# Patient Record
Sex: Male | Born: 1995 | Race: White | Hispanic: No | Marital: Single | Smoking: Never smoker
Health system: Southern US, Community
[De-identification: ages and names within clinical notes are randomized; demographics above are authoritative.]

## PROBLEM LIST (undated history)

## (undated) DIAGNOSIS — F32A Depression, unspecified: Secondary | ICD-10-CM

## (undated) DIAGNOSIS — F329 Major depressive disorder, single episode, unspecified: Secondary | ICD-10-CM

---
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
## 2016-06-23 DIAGNOSIS — S161XXA Strain of muscle, fascia and tendon at neck level, initial encounter: Secondary | ICD-10-CM | POA: Diagnosis not present

## 2016-06-23 DIAGNOSIS — S8002XA Contusion of left knee, initial encounter: Secondary | ICD-10-CM | POA: Diagnosis not present

## 2016-06-29 DIAGNOSIS — S8002XA Contusion of left knee, initial encounter: Secondary | ICD-10-CM | POA: Diagnosis not present

## 2016-06-29 DIAGNOSIS — M2242 Chondromalacia patellae, left knee: Secondary | ICD-10-CM | POA: Diagnosis not present

## 2016-08-03 DIAGNOSIS — S83282A Other tear of lateral meniscus, current injury, left knee, initial encounter: Secondary | ICD-10-CM | POA: Diagnosis not present

## 2016-08-10 DIAGNOSIS — M25562 Pain in left knee: Secondary | ICD-10-CM | POA: Diagnosis not present

## 2016-08-12 DIAGNOSIS — M25562 Pain in left knee: Secondary | ICD-10-CM | POA: Diagnosis not present

## 2016-09-07 DIAGNOSIS — H5213 Myopia, bilateral: Secondary | ICD-10-CM | POA: Diagnosis not present

## 2016-12-15 DIAGNOSIS — J029 Acute pharyngitis, unspecified: Secondary | ICD-10-CM | POA: Diagnosis not present

## 2017-01-26 DIAGNOSIS — M25562 Pain in left knee: Secondary | ICD-10-CM | POA: Diagnosis not present

## 2017-01-28 DIAGNOSIS — M25562 Pain in left knee: Secondary | ICD-10-CM | POA: Diagnosis not present

## 2017-01-28 DIAGNOSIS — M222X2 Patellofemoral disorders, left knee: Secondary | ICD-10-CM | POA: Diagnosis not present

## 2017-01-28 DIAGNOSIS — R531 Weakness: Secondary | ICD-10-CM | POA: Diagnosis not present

## 2017-01-31 DIAGNOSIS — R531 Weakness: Secondary | ICD-10-CM | POA: Diagnosis not present

## 2017-01-31 DIAGNOSIS — M25562 Pain in left knee: Secondary | ICD-10-CM | POA: Diagnosis not present

## 2017-01-31 DIAGNOSIS — M222X2 Patellofemoral disorders, left knee: Secondary | ICD-10-CM | POA: Diagnosis not present

## 2017-02-04 DIAGNOSIS — M25562 Pain in left knee: Secondary | ICD-10-CM | POA: Diagnosis not present

## 2017-02-04 DIAGNOSIS — R531 Weakness: Secondary | ICD-10-CM | POA: Diagnosis not present

## 2017-02-04 DIAGNOSIS — M222X2 Patellofemoral disorders, left knee: Secondary | ICD-10-CM | POA: Diagnosis not present

## 2017-02-07 DIAGNOSIS — M222X2 Patellofemoral disorders, left knee: Secondary | ICD-10-CM | POA: Diagnosis not present

## 2017-02-07 DIAGNOSIS — R531 Weakness: Secondary | ICD-10-CM | POA: Diagnosis not present

## 2017-02-07 DIAGNOSIS — M25562 Pain in left knee: Secondary | ICD-10-CM | POA: Diagnosis not present

## 2017-02-14 DIAGNOSIS — R531 Weakness: Secondary | ICD-10-CM | POA: Diagnosis not present

## 2017-02-14 DIAGNOSIS — M25562 Pain in left knee: Secondary | ICD-10-CM | POA: Diagnosis not present

## 2017-02-14 DIAGNOSIS — M222X2 Patellofemoral disorders, left knee: Secondary | ICD-10-CM | POA: Diagnosis not present

## 2017-02-17 DIAGNOSIS — M25562 Pain in left knee: Secondary | ICD-10-CM | POA: Diagnosis not present

## 2017-02-17 DIAGNOSIS — R531 Weakness: Secondary | ICD-10-CM | POA: Diagnosis not present

## 2017-02-17 DIAGNOSIS — M222X2 Patellofemoral disorders, left knee: Secondary | ICD-10-CM | POA: Diagnosis not present

## 2017-02-24 DIAGNOSIS — M222X2 Patellofemoral disorders, left knee: Secondary | ICD-10-CM | POA: Diagnosis not present

## 2017-02-24 DIAGNOSIS — M25562 Pain in left knee: Secondary | ICD-10-CM | POA: Diagnosis not present

## 2017-02-24 DIAGNOSIS — R531 Weakness: Secondary | ICD-10-CM | POA: Diagnosis not present

## 2017-03-03 DIAGNOSIS — M222X2 Patellofemoral disorders, left knee: Secondary | ICD-10-CM | POA: Diagnosis not present

## 2017-03-03 DIAGNOSIS — R531 Weakness: Secondary | ICD-10-CM | POA: Diagnosis not present

## 2017-03-03 DIAGNOSIS — M25562 Pain in left knee: Secondary | ICD-10-CM | POA: Diagnosis not present

## 2017-03-09 DIAGNOSIS — F341 Dysthymic disorder: Secondary | ICD-10-CM | POA: Diagnosis not present

## 2017-03-09 DIAGNOSIS — F422 Mixed obsessional thoughts and acts: Secondary | ICD-10-CM | POA: Diagnosis not present

## 2017-03-09 DIAGNOSIS — F322 Major depressive disorder, single episode, severe without psychotic features: Secondary | ICD-10-CM | POA: Diagnosis not present

## 2017-03-09 DIAGNOSIS — F411 Generalized anxiety disorder: Secondary | ICD-10-CM | POA: Diagnosis not present

## 2017-03-14 DIAGNOSIS — F411 Generalized anxiety disorder: Secondary | ICD-10-CM | POA: Diagnosis not present

## 2017-03-14 DIAGNOSIS — F4010 Social phobia, unspecified: Secondary | ICD-10-CM | POA: Diagnosis not present

## 2017-03-14 DIAGNOSIS — F322 Major depressive disorder, single episode, severe without psychotic features: Secondary | ICD-10-CM | POA: Diagnosis not present

## 2017-03-22 DIAGNOSIS — L700 Acne vulgaris: Secondary | ICD-10-CM | POA: Diagnosis not present

## 2017-03-24 DIAGNOSIS — F322 Major depressive disorder, single episode, severe without psychotic features: Secondary | ICD-10-CM | POA: Diagnosis not present

## 2017-03-24 DIAGNOSIS — F4010 Social phobia, unspecified: Secondary | ICD-10-CM | POA: Diagnosis not present

## 2017-03-24 DIAGNOSIS — F422 Mixed obsessional thoughts and acts: Secondary | ICD-10-CM | POA: Diagnosis not present

## 2017-03-24 DIAGNOSIS — F411 Generalized anxiety disorder: Secondary | ICD-10-CM | POA: Diagnosis not present

## 2017-04-04 DIAGNOSIS — F322 Major depressive disorder, single episode, severe without psychotic features: Secondary | ICD-10-CM | POA: Diagnosis not present

## 2017-04-06 ENCOUNTER — Emergency Department (HOSPITAL_COMMUNITY)
Admission: EM | Admit: 2017-04-06 | Discharge: 2017-04-06 | Disposition: A | Discharge status: Home / Self Care | Payer: BLUE CROSS/BLUE SHIELD | Attending: Emergency Medicine | Admitting: Emergency Medicine

## 2017-04-06 ENCOUNTER — Emergency Department (HOSPITAL_COMMUNITY): Payer: BLUE CROSS/BLUE SHIELD

## 2017-04-06 ENCOUNTER — Encounter (HOSPITAL_COMMUNITY): Payer: Self-pay | Admitting: Emergency Medicine

## 2017-04-06 DIAGNOSIS — R079 Chest pain, unspecified: Secondary | ICD-10-CM | POA: Diagnosis not present

## 2017-04-06 DIAGNOSIS — K30 Functional dyspepsia: Secondary | ICD-10-CM | POA: Diagnosis not present

## 2017-04-06 DIAGNOSIS — F411 Generalized anxiety disorder: Secondary | ICD-10-CM | POA: Diagnosis not present

## 2017-04-06 DIAGNOSIS — R0602 Shortness of breath: Secondary | ICD-10-CM | POA: Diagnosis not present

## 2017-04-06 DIAGNOSIS — R0789 Other chest pain: Secondary | ICD-10-CM

## 2017-04-06 DIAGNOSIS — T50905A Adverse effect of unspecified drugs, medicaments and biological substances, initial encounter: Secondary | ICD-10-CM | POA: Diagnosis not present

## 2017-04-06 DIAGNOSIS — F422 Mixed obsessional thoughts and acts: Secondary | ICD-10-CM | POA: Diagnosis not present

## 2017-04-06 DIAGNOSIS — F322 Major depressive disorder, single episode, severe without psychotic features: Secondary | ICD-10-CM | POA: Diagnosis not present

## 2017-04-06 HISTORY — DX: Depression, unspecified: F32.A

## 2017-04-06 HISTORY — DX: Major depressive disorder, single episode, unspecified: F32.9

## 2017-04-06 LAB — CBC
HCT: 44.5 % (ref 39.0–52.0)
Hemoglobin: 14.5 g/dL (ref 13.0–17.0)
MCH: 28.7 pg (ref 26.0–34.0)
MCHC: 32.6 g/dL (ref 30.0–36.0)
MCV: 88.1 fL (ref 78.0–100.0)
PLATELETS: 156 10*3/uL (ref 150–400)
RBC: 5.05 MIL/uL (ref 4.22–5.81)
RDW: 13.3 % (ref 11.5–15.5)
WBC: 5.8 10*3/uL (ref 4.0–10.5)

## 2017-04-06 LAB — BASIC METABOLIC PANEL
Anion gap: 6 (ref 5–15)
BUN: 7 mg/dL (ref 6–20)
CHLORIDE: 105 mmol/L (ref 101–111)
CO2: 26 mmol/L (ref 22–32)
CREATININE: 0.93 mg/dL (ref 0.61–1.24)
Calcium: 9.1 mg/dL (ref 8.9–10.3)
GFR calc Af Amer: >60 mL/min (ref >60)
GFR calc non Af Amer: >60 mL/min (ref >60)
Glucose, Bld: 94 mg/dL (ref 65–99)
Potassium: 4.1 mmol/L (ref 3.5–5.1)
SODIUM: 137 mmol/L (ref 135–145)

## 2017-04-06 LAB — I-STAT TROPONIN, ED: Troponin i, poc: 0 ng/mL (ref 0.00–0.08)

## 2017-04-06 MED ORDER — OMEPRAZOLE 20 MG PO CPDR: 20.0000 mg | DELAYED_RELEASE_CAPSULE | Freq: Every day | ORAL | 0 refills | Status: DC

## 2017-04-06 NOTE — Discharge Instructions (Signed)
Please read attached information. If you experience any new or worsening signs or symptoms please return to the emergency room for evaluation. Please follow-up with your primary care provider or specialist as discussed. Please use medication prescribed only as directed and discontinue taking if you have any concerning signs or symptoms.   °

## 2017-04-06 NOTE — ED Triage Notes (Signed)
Cp he had this am mid chest gone now but le.ft him feeling weak  And nausea  And he felt sob, denies radiation to arm neck or shoulder

## 2017-04-06 NOTE — ED Provider Notes (Signed)
[facility]-EMERGENCY DEPT Provider Note   CSN: [id number] Arrival date & time: 04/06/17  1022  By signing my name below, I, [name], attest that this documentation has been prepared under the direction and in the presence of [employer], PA-C. Electronically Signed: [name], Scribe. 04/06/17. 1:00 PM.  History   Chief Complaint Chief Complaint  Patient presents with  . Chest Pain    HPI Comments: Jonathan Benson is a 21 y.o. male with a history of anxiety and depression, who presents to the Emergency Department complaining of gradual-onset, intermittent chest pain that began earlier this morning. Patient states he developed pressure-like chest pain at 8 AM this morning while driving. Patient states this episode lasted 10 minutes.  Patient states his symptoms have gradually improved since the day has progressed. Patient reports being seen at urgent care earlier today, he was advised to come to the emergency department for evaluation. Patient reports associated fatigue and nausea. No medications taken prior to arrival. Patient denies any tobacco use, increases caffine intake, recent travel, recent surgeries, hx of asthma, or any hx of CAD. Patient denies any shortness of breath, diaphoresis, leg swelling, fever, or chills.   The history is provided by the patient. No language interpreter was used.    Past Medical History:  Diagnosis Date  . Depression     There are no active problems to display for this patient.   No past surgical history on file.     Home Medications    Prior to Admission medications   Medication Sig Start Date End Date Taking? Authorizing Provider  omeprazole (PRILOSEC) 20 MG capsule Take 1 capsule (20 mg total) by mouth daily. 04/06/17   Eyvonne Mechanic, PA-C    Family History No family history on file.  Social History Social History  Substance Use Topics  . Smoking status: Never Smoker  . Smokeless tobacco: Never Used  . Alcohol use No      Allergies   Patient has no known allergies.   Review of Systems Review of Systems  Constitutional: Positive for fatigue. Negative for chills, diaphoresis and fever.  Respiratory: Positive for shortness of breath.   Cardiovascular: Positive for chest pain. Negative for leg swelling.  Gastrointestinal: Positive for nausea. Negative for diarrhea and vomiting.  All other systems reviewed and are negative.    Physical Exam Updated Vital Signs BP 110/66 (BP Location: Right Arm)   Pulse 76   Temp 98.2 F (36.8 C) (Oral)   Resp 19   SpO2 97%   Physical Exam  Constitutional: He is oriented to person, place, and time. He appears well-developed.  HENT:  Head: Normocephalic and atraumatic.  Mouth/Throat: Oropharynx is clear and moist.  Eyes: Conjunctivae and EOM are normal. Pupils are equal, round, and reactive to light.  Neck: Normal range of motion. Neck supple.  Cardiovascular: Normal rate, regular rhythm, normal heart sounds and intact distal pulses.  Exam reveals no gallop and no friction rub.   No murmur heard. Pulmonary/Chest: Effort normal and breath sounds normal. He has no wheezes. He has no rales. He exhibits tenderness.  Tenderness over the left lateral chest wall.   Abdominal: Soft. Bowel sounds are normal. There is no tenderness.  Musculoskeletal: Normal range of motion. He exhibits no edema or tenderness.  No lower extremity edema.   Neurological: He is alert and oriented to person, place, and time.  Skin: Skin is warm and dry.  Psychiatric: He has a normal mood and affect.  Nursing note  and vitals reviewed.    ED Treatments / Results  DIAGNOSTIC STUDIES: Oxygen Saturation is 97% on RA, normal by my interpretation.    COORDINATION OF CARE: 12:59 PM Discussed treatment plan with pt at bedside and pt agreed to plan.   Labs (all labs ordered are listed, but only abnormal results are displayed) Labs Reviewed  BASIC METABOLIC PANEL  CBC  I-STAT TROPOININ,  ED    EKG  EKG Interpretation  Date/Time:  Wednesday April 06 2017 10:45:28 EDT Ventricular Rate:  70 PR Interval:  146 QRS Duration: 86 QT Interval:  368 QTC Calculation: 397 R Axis:   90 Text Interpretation:  Normal sinus rhythm Rightward axis Borderline ECG Confirmed by Donnetta Hutchingook, Brian ([id number]) on 04/07/2017 1:08:09 PM       Radiology Dg Chest 2 View  Result Date: 04/06/2017 CLINICAL DATA:  Chest pain, SOB and overall weakness since this morning - no known heart or lung issues, nonsmoker EXAM: CHEST  2 VIEW COMPARISON:  None. FINDINGS: Normal mediastinum and cardiac silhouette. Normal pulmonary vasculature. No evidence of effusion, infiltrate, or pneumothorax. No acute bony abnormality. IMPRESSION: Normal chest radiograph. Electronically Signed   By: Genevive BiStewart  Edmunds M.D.   On: 04/06/2017 11:11    Procedures Procedures (including critical care time)  Medications Ordered in ED Medications - No data to display   Initial Impression / Assessment and Plan / ED Course  I have reviewed the triage vital signs and the nursing notes.  Pertinent labs & imaging results that were available during my care of the patient were reviewed by me and considered in my medical decision making (see chart for details).      Final Clinical Impressions(s) / ED Diagnoses   Final diagnoses:  Chest wall pain  Indigestion    21 year old male presents today with vague complaints of chest pain.  Likely chest wall pain.  Patient also having indigestion.  Patient was given Prilosec, outpatient follow-up and strict return precautions.  Both patient and his mother verbalized understanding and agreement to today's plan had no further questions or concerns the time discharge  New Prescriptions Discharge Medication List as of 04/06/2017 12:41 PM    START taking these medications   Details  omeprazole (PRILOSEC) 20 MG capsule Take 1 capsule (20 mg total) by mouth daily., Starting Wed 04/06/2017, Print         I personally performed the services described in this documentation, which was scribed in my presence. The recorded information has been reviewed and is accurate.     Eyvonne MechanicHedges, Temperance Kelemen, PA-C 04/07/17 2031    Geoffery Lyonselo, Douglas, MD 04/09/17 0000

## 2017-04-07 DIAGNOSIS — F422 Mixed obsessional thoughts and acts: Secondary | ICD-10-CM | POA: Diagnosis not present

## 2017-04-07 DIAGNOSIS — F322 Major depressive disorder, single episode, severe without psychotic features: Secondary | ICD-10-CM | POA: Diagnosis not present

## 2017-04-07 DIAGNOSIS — F411 Generalized anxiety disorder: Secondary | ICD-10-CM | POA: Diagnosis not present

## 2017-04-07 DIAGNOSIS — F4010 Social phobia, unspecified: Secondary | ICD-10-CM | POA: Diagnosis not present

## 2017-04-19 DIAGNOSIS — F322 Major depressive disorder, single episode, severe without psychotic features: Secondary | ICD-10-CM | POA: Diagnosis not present

## 2017-04-19 DIAGNOSIS — F4010 Social phobia, unspecified: Secondary | ICD-10-CM | POA: Diagnosis not present

## 2017-04-19 DIAGNOSIS — F411 Generalized anxiety disorder: Secondary | ICD-10-CM | POA: Diagnosis not present

## 2017-04-19 DIAGNOSIS — F422 Mixed obsessional thoughts and acts: Secondary | ICD-10-CM | POA: Diagnosis not present

## 2017-04-20 DIAGNOSIS — F422 Mixed obsessional thoughts and acts: Secondary | ICD-10-CM | POA: Diagnosis not present

## 2017-04-20 DIAGNOSIS — F411 Generalized anxiety disorder: Secondary | ICD-10-CM | POA: Diagnosis not present

## 2017-04-20 DIAGNOSIS — F322 Major depressive disorder, single episode, severe without psychotic features: Secondary | ICD-10-CM | POA: Diagnosis not present

## 2017-04-20 DIAGNOSIS — F4010 Social phobia, unspecified: Secondary | ICD-10-CM | POA: Diagnosis not present

## 2017-05-03 DIAGNOSIS — F411 Generalized anxiety disorder: Secondary | ICD-10-CM | POA: Diagnosis not present

## 2017-05-03 DIAGNOSIS — F322 Major depressive disorder, single episode, severe without psychotic features: Secondary | ICD-10-CM | POA: Diagnosis not present

## 2017-05-03 DIAGNOSIS — F422 Mixed obsessional thoughts and acts: Secondary | ICD-10-CM | POA: Diagnosis not present

## 2017-05-03 DIAGNOSIS — F4010 Social phobia, unspecified: Secondary | ICD-10-CM | POA: Diagnosis not present

## 2017-05-04 DIAGNOSIS — F422 Mixed obsessional thoughts and acts: Secondary | ICD-10-CM | POA: Diagnosis not present

## 2017-05-04 DIAGNOSIS — F4010 Social phobia, unspecified: Secondary | ICD-10-CM | POA: Diagnosis not present

## 2017-05-04 DIAGNOSIS — F322 Major depressive disorder, single episode, severe without psychotic features: Secondary | ICD-10-CM | POA: Diagnosis not present

## 2017-05-04 DIAGNOSIS — F411 Generalized anxiety disorder: Secondary | ICD-10-CM | POA: Diagnosis not present

## 2017-05-18 DIAGNOSIS — F322 Major depressive disorder, single episode, severe without psychotic features: Secondary | ICD-10-CM | POA: Diagnosis not present

## 2017-05-18 DIAGNOSIS — F411 Generalized anxiety disorder: Secondary | ICD-10-CM | POA: Diagnosis not present

## 2017-05-18 DIAGNOSIS — F422 Mixed obsessional thoughts and acts: Secondary | ICD-10-CM | POA: Diagnosis not present

## 2017-05-19 DIAGNOSIS — F422 Mixed obsessional thoughts and acts: Secondary | ICD-10-CM | POA: Diagnosis not present

## 2017-05-19 DIAGNOSIS — F322 Major depressive disorder, single episode, severe without psychotic features: Secondary | ICD-10-CM | POA: Diagnosis not present

## 2017-05-19 DIAGNOSIS — F411 Generalized anxiety disorder: Secondary | ICD-10-CM | POA: Diagnosis not present

## 2017-05-19 DIAGNOSIS — F4010 Social phobia, unspecified: Secondary | ICD-10-CM | POA: Diagnosis not present

## 2017-06-02 DIAGNOSIS — F4010 Social phobia, unspecified: Secondary | ICD-10-CM | POA: Diagnosis not present

## 2017-06-02 DIAGNOSIS — F411 Generalized anxiety disorder: Secondary | ICD-10-CM | POA: Diagnosis not present

## 2017-06-02 DIAGNOSIS — F422 Mixed obsessional thoughts and acts: Secondary | ICD-10-CM | POA: Diagnosis not present

## 2017-06-02 DIAGNOSIS — F322 Major depressive disorder, single episode, severe without psychotic features: Secondary | ICD-10-CM | POA: Diagnosis not present

## 2017-06-08 DIAGNOSIS — F422 Mixed obsessional thoughts and acts: Secondary | ICD-10-CM | POA: Diagnosis not present

## 2017-06-08 DIAGNOSIS — F411 Generalized anxiety disorder: Secondary | ICD-10-CM | POA: Diagnosis not present

## 2017-06-08 DIAGNOSIS — F4010 Social phobia, unspecified: Secondary | ICD-10-CM | POA: Diagnosis not present

## 2017-06-08 DIAGNOSIS — F322 Major depressive disorder, single episode, severe without psychotic features: Secondary | ICD-10-CM | POA: Diagnosis not present

## 2017-08-19 DIAGNOSIS — J039 Acute tonsillitis, unspecified: Secondary | ICD-10-CM | POA: Diagnosis not present

## 2017-09-07 ENCOUNTER — Ambulatory Visit: Payer: BLUE CROSS/BLUE SHIELD | Admitting: Psychology

## 2017-09-08 DIAGNOSIS — J020 Streptococcal pharyngitis: Secondary | ICD-10-CM | POA: Diagnosis not present

## 2017-09-13 ENCOUNTER — Ambulatory Visit: Payer: BLUE CROSS/BLUE SHIELD | Admitting: Psychology

## 2017-10-25 DIAGNOSIS — J039 Acute tonsillitis, unspecified: Secondary | ICD-10-CM | POA: Diagnosis not present

## 2017-12-22 DIAGNOSIS — H52203 Unspecified astigmatism, bilateral: Secondary | ICD-10-CM | POA: Diagnosis not present

## 2017-12-22 DIAGNOSIS — H5213 Myopia, bilateral: Secondary | ICD-10-CM | POA: Diagnosis not present

## 2018-02-28 DIAGNOSIS — R5383 Other fatigue: Secondary | ICD-10-CM | POA: Diagnosis not present

## 2018-05-17 ENCOUNTER — Encounter: Payer: Self-pay | Admitting: Physician Assistant

## 2018-05-17 ENCOUNTER — Ambulatory Visit: Payer: BLUE CROSS/BLUE SHIELD | Admitting: Physician Assistant

## 2018-05-17 ENCOUNTER — Other Ambulatory Visit: Payer: Self-pay

## 2018-05-17 VITALS — BP 124/70 | HR 86 | Temp 98.8°F | Resp 18 | Ht 65.55 in | Wt 136.2 lb

## 2018-05-17 DIAGNOSIS — J029 Acute pharyngitis, unspecified: Secondary | ICD-10-CM

## 2018-05-17 DIAGNOSIS — L299 Pruritus, unspecified: Secondary | ICD-10-CM | POA: Diagnosis not present

## 2018-05-17 LAB — POCT RAPID STREP A (OFFICE): Rapid Strep A Screen: NEGATIVE

## 2018-05-17 MED ORDER — HYDROXYZINE HCL 25 MG PO TABS: 25.0000 mg | ORAL_TABLET | Freq: Three times a day (TID) | ORAL | 1 refills | Status: AC | PRN

## 2018-05-17 NOTE — Patient Instructions (Addendum)
  Sign up for mychart I will contact you about your strep results.   For sore throat: Stay well hydrated. Get lost of rest. Wash your hands often.  Advil or ibuprofen for pain. Do not take Aspirin.  Drink enough water and fluids to keep your urine clear or pale yellow.  ? Gargle with 8 oz of salt water ( tsp of salt per 1 qt of water) as often as every 1-2 hours to soothe your throat.  Gargle liquid benadryl.  Cepacol throat lozenges (if you are not at risk for choking).  For sore throat try using a honey-based tea. Use 3 teaspoons of honey with juice squeezed from half lemon. Place shaved pieces of ginger into 1/2-1 cup of water and warm over stove top. Then mix the ingredients and repeat every 4 hours as needed.   For your itching, start taking Hydroxyzine '25mg'$  every 4-6 hours as needed Try to notice if there is an association between itching and heat/anxiety/etc.   Come back for an annual exam in the next month or so.   Thank you for coming in today. I hope you feel we met your needs.  Feel free to call PCP if you have any questions or further requests.  Please consider signing up for MyChart if you do not already have it, as this is a great way to communicate with me.  Best,  Whitney McVey, PA-C  IF you received an x-ray today, you will receive an invoice from [facility]. Please contact [facility] at [phone] with questions or concerns regarding your invoice.   IF you received labwork today, you will receive an invoice from [facility]. Please contact [facility] at [phone] with questions or concerns regarding your invoice.   Our billing staff will not be able to assist you with questions regarding bills from these companies.  You will be contacted with the lab results as soon as they are available. The fastest way to get your results is to activate your My Chart account. Instructions are located on the last page of this paperwork. If you have not heard  from [place] regarding the results in 2 weeks, please contact this office.

## 2018-05-17 NOTE — Progress Notes (Signed)
Edger HouseDaniel G [place]adonna  MRN: [id number] DOB: 11-12-1995  PCP: Patient, No Pcp Per  Subjective:  Pt is a pleasant 22 year old male who presents to clinic for sore throat x 3 days.  He is a new patient to this clinic does not have a primary care provider.   Sore throat-  he endorses pain with swallowing.  Pain is of both sides of his throat.  He is not taking anything for his symptoms.  He is able to eat and drink just fine.  Denies cough, fever, chills, nausea, vomiting, difficulty breathing. He went to the urgent care 3 times earlier this year for similar symptoms, rapid strep's were negative x2.  Positive rapid strep at his last visit and he received antibiotics.  He is unaware whether cultures were sent.  Endorses fatigue x 1 year. "I can sleep like 10 hours". Denies new medications, dizziness, lightheadedness, morning headaches, extreme daytime fatigue.    Endorses itching all over. Happens intermittently. Is unsure if this is possibly related to anxiety. Nothing makes it better or worse. Happens more when he is just sitting idle. He has not taken anything to feel better. No new medications. Admits to noticing itching when he was taking Xanax - last dose was about 8 months ago.  Denies rash.  Review of Systems  Constitutional: Positive for fatigue. Negative for chills, diaphoresis and fever.  HENT: Positive for sore throat. Negative for congestion, postnasal drip, rhinorrhea, sinus pressure, sinus pain and sneezing.   Respiratory: Negative for cough, shortness of breath and wheezing.     There are no active problems to display for this patient.   No current outpatient medications on file prior to visit.   No current facility-administered medications on file prior to visit.     No Known Allergies   Objective:  BP 124/70   Pulse 86   Temp 98.8 F (37.1 C) (Oral)   Resp 18   Ht 5' 5.55" (1.665 m)   Wt 136 lb 3.2 oz (61.8 kg)   SpO2 97%   BMI 22.29 kg/m   Physical Exam    Constitutional: He is oriented to person, place, and time. No distress.  HENT:  Right Ear: Tympanic membrane normal.  Left Ear: Tympanic membrane normal.  Nose: Mucosal edema present. No rhinorrhea. Right sinus exhibits no maxillary sinus tenderness and no frontal sinus tenderness. Left sinus exhibits no maxillary sinus tenderness and no frontal sinus tenderness.  Mouth/Throat: Mucous membranes are normal. Posterior oropharyngeal edema and posterior oropharyngeal erythema present. No oropharyngeal exudate or tonsillar abscesses.  Cardiovascular: Normal rate, regular rhythm and normal heart sounds.  Pulmonary/Chest: Effort normal and breath sounds normal. No respiratory distress. He has no wheezes. He has no rales.  Neurological: He is alert and oriented to person, place, and time.  Skin: Skin is warm and dry.  Psychiatric: Judgment normal.  Vitals reviewed.   Results for orders placed or performed in visit on 05/17/18  POCT rapid strep A  Result Value Ref Range   Rapid Strep A Screen Negative Negative    Assessment and Plan :  1. Sore throat -Patient presents with sore throat for the past 3 days.  No red flags.  Vitals are stable.  Rapid strep is negative, culture is pending.  Will contact with results and decide on treatment at that time. - POCT rapid strep A - Culture, Group A Strep  2. Itching -Patient endorses itching all over for over a year.  Prior association with  Xanax, however he has not taken this in 8 months.  No associated rash.  He also endorses fatigue.  Advised patient to make an appointment for an annual exam for routine lab work.  Will treat symptoms with hydroxyzine at this time. - hydrOXYzine (ATARAX/VISTARIL) 25 MG tablet; Take 1 tablet (25 mg total) by mouth every 8 (eight) hours as needed for itching.  Dispense: 30 tablet; Refill: 1    Whitney Reuel Lamadrid, PA-C  [facility] at [facility] 05/17/2018 5:13 PM  Please note: Portions of this  report may have been transcribed using dragon voice recognition software. Every effort was made to ensure accuracy; however, inadvertent computerized transcription errors may be present.

## 2018-05-19 LAB — CULTURE, GROUP A STREP: Strep A Culture: NEGATIVE

## 2018-05-22 ENCOUNTER — Ambulatory Visit: Payer: BLUE CROSS/BLUE SHIELD | Admitting: Physician Assistant

## 2018-05-22 ENCOUNTER — Encounter: Payer: Self-pay | Admitting: Physician Assistant

## 2018-05-22 ENCOUNTER — Other Ambulatory Visit: Payer: Self-pay

## 2018-05-22 VITALS — BP 100/64 | HR 99 | Temp 98.2°F | Resp 16 | Ht 65.0 in | Wt 133.4 lb

## 2018-05-22 DIAGNOSIS — Z113 Encounter for screening for infections with a predominantly sexual mode of transmission: Secondary | ICD-10-CM

## 2018-05-22 DIAGNOSIS — J029 Acute pharyngitis, unspecified: Secondary | ICD-10-CM | POA: Diagnosis not present

## 2018-05-22 LAB — POCT RAPID STREP A (OFFICE): Rapid Strep A Screen: NEGATIVE

## 2018-05-22 MED ORDER — PENICILLIN V POTASSIUM 500 MG PO TABS: 500.0000 mg | ORAL_TABLET | Freq: Two times a day (BID) | ORAL | 0 refills | Status: AC

## 2018-05-22 NOTE — Patient Instructions (Addendum)
Start taking penicillin V 500 mg two times daily for 10 days.  I will contact you with the results of your labs when they come back.   For sore throat: ? Gargle with 8 oz of salt water ( tsp of salt per 1 qt of water) as often as every 1-2 hours to soothe your throat.  Gargle liquid benadryl.  Cepacol throat lozenges.  Stay well hydrated. Get lost of rest. Wash your hands often.   -Foods that can help speed recovery: honey, garlic, chicken soup, elderberries, green tea.  -Supplements that can help speed recovery: vitamin C, zinc, elderberry extract, quercetin, ginseng, selenium -Supplement with prebiotics and probiotics:   Advil or ibuprofen for pain. Do not take Aspirin.    For sore throat try using a honey-based tea. Use 3 teaspoons of honey with juice squeezed from half lemon. Place shaved pieces of ginger into 1/2-1 cup of water and warm over stove top. Then mix the ingredients and repeat every 4 hours as needed.   Safe Sex Practicing safe sex means taking steps before and during sex to reduce your risk of:  Getting an STD (sexually transmitted disease).  Giving your partner an STD.  Unwanted pregnancy.  How can I practice safe sex?  To practice safe sex:  Limit your sexual partners to only one partner who is having sex with only you.  Avoid using alcohol and recreational drugs before having sex. These substances can affect your judgment.  Before having sex with a new partner: ? Talk to your partner about past partners, past STDs, and drug use. ? You and your partner should be screened for STDs and discuss the results with each other.  Check your body regularly for sores, blisters, rashes, or unusual discharge. If you notice any of these problems, visit your health care provider.  If you have symptoms of an infection or you are being treated for an STD, avoid sexual contact.  While having sex, use a condom. Make sure to: ? Use a condom every time you have vaginal,  oral, or anal sex. Both females and males should wear condoms during oral sex. ? Keep condoms in place from the beginning to the end of sexual activity. ? Use a latex condom, if possible. Latex condoms offer the best protection. ? Use only water-based lubricants or oils to lubricate a condom. Using petroleum-based lubricants or oils will weaken the condom and increase the chance that it will break.  See your health care provider for regular screenings, exams, and tests for STDs.  Talk with your health care provider about the form of birth control (contraception) that is best for you.  Get vaccinated against hepatitis B and human papillomavirus (HPV).  If you are at risk of being infected with HIV (human immunodeficiency virus), talk with your health care provider about taking a prescription medicine to prevent HIV infection. You are considered at risk for HIV if: ? You are a man who has sex with other men. ? You are a heterosexual man or woman who is sexually active with more than one partner. ? You take drugs by injection. ? You are sexually active with a partner who has HIV.  This information is not intended to replace advice given to you by your health care provider. Make sure you discuss any questions you have with your health care provider. Document Released: 10/21/2004 Document Revised: 01/28/2016 Document Reviewed: 08/03/2015 Elsevier Interactive Patient Education  [employer]2018 Elsevier Inc.  If you have lab work  done today you will be contacted with your lab results within the next 2 weeks.  If you have not heard from [place] then please contact us. The fastest way to get your results is to register for My Chart.   IF you received an x-ray today, you will receive an invoice from [facility]. Please contact [facility] at [phone] with questions or concerns regarding your invoice.   IF you received labwork today, you will receive an invoice from [facility]. Please contact  [facility] at [phone] with questions or concerns regarding your invoice.   Our billing staff will not be able to assist you with questions regarding bills from these companies.  You will be contacted with the lab results as soon as they are available. The fastest way to get your results is to activate your My Chart account. Instructions are located on the last page of this paperwork. If you have not heard from [place] regarding the results in 2 weeks, please contact this office.

## 2018-05-22 NOTE — Progress Notes (Signed)
Edger HouseDaniel G Fasnacht  MRN: [id number] DOB: 02-19-1996  PCP: Patient, No Pcp Per  Subjective:  Pt is a pleasant 22 year old male who presents to clinic for f/u sore throat.  He was here last week with the same c/c, negative rapid strep and strep culture. Symptoms have worsened. Endorses pain with swallowing. He had three negative rapid strep tests then one positive earlier this year. Felt better after antibiotics.  Never been evaluated by ENT for this problem.   Pt has had one sexual partner. Currently sexually active. He has never had routine STD screening. Would like this done today. Denies discharge from penis, abdominal pain, back pain, fever, chills, pain of penis or testicles.   Review of Systems  Constitutional: Positive for fatigue. Negative for chills, diaphoresis and fever.  HENT: Positive for sore throat. Negative for congestion, postnasal drip, rhinorrhea and trouble swallowing.   Respiratory: Negative for cough, shortness of breath and wheezing.   Gastrointestinal: Negative for nausea and vomiting.  Genitourinary: Negative for difficulty urinating, discharge, dysuria, flank pain, frequency, penile pain, scrotal swelling, testicular pain and urgency.  Skin: Negative for rash.    There are no active problems to display for this patient.   Current Outpatient Medications on File Prior to Visit  Medication Sig Dispense Refill  . hydrOXYzine (ATARAX/VISTARIL) 25 MG tablet Take 1 tablet (25 mg total) by mouth every 8 (eight) hours as needed for itching. (Patient not taking: Reported on 05/22/2018) 30 tablet 1   No current facility-administered medications on file prior to visit.     No Known Allergies   Objective:  BP 100/64 (BP Location: Left Arm, Patient Position: Sitting, Cuff Size: Normal)   Pulse 99   Temp 98.2 F (36.8 C) (Oral)   Resp 16   Ht 5\' 5"  (1.651 m)   Wt 133 lb 6.4 oz (60.5 kg)   SpO2 96%   BMI 22.20 kg/m   Physical Exam  Constitutional: He is  oriented to person, place, and time. No distress.  HENT:  Right Ear: Tympanic membrane normal.  Left Ear: Tympanic membrane normal.  Nose: Mucosal edema present. No rhinorrhea. Right sinus exhibits no maxillary sinus tenderness and no frontal sinus tenderness. Left sinus exhibits no maxillary sinus tenderness and no frontal sinus tenderness.  Mouth/Throat: Uvula is midline and mucous membranes are normal. Posterior oropharyngeal erythema present.  Cardiovascular: Normal rate, regular rhythm and normal heart sounds.  Pulmonary/Chest: Effort normal and breath sounds normal. No respiratory distress. He has no wheezes. He has no rales.  Neurological: He is alert and oriented to person, place, and time.  Skin: Skin is warm and dry.  Psychiatric: Judgment normal.  Vitals reviewed.   Results for orders placed or performed in visit on 05/22/18  POCT rapid strep A  Result Value Ref Range   Rapid Strep A Screen Negative Negative    Assessment and Plan :  1. Sore throat - pt presents for worsening sore throat. Recent negative rapid strep and culture. Plan to treat due to worsening symptoms. Will check GC throat.  - POCT rapid strep A - Culture, Group A Strep - GC NAA, Pharyngeal - penicillin v potassium (VEETID) 500 MG tablet; Take 1 tablet (500 mg total) by mouth 2 (two) times daily.  Dispense: 20 tablet; Refill: 0  2. Screen for STD (sexually transmitted disease) - Chlamydia/Gonococcus/Trichomonas, NAA  Marco CollieWhitney Loran Fleet, PA-C  [facility] 05/22/2018 9:18 AM  Please note: Portions of this  report may have been transcribed using dragon voice recognition software. Every effort was made to ensure accuracy; however, inadvertent computerized transcription errors may be present.

## 2018-05-23 LAB — CHLAMYDIA/GONOCOCCUS/TRICHOMONAS, NAA
Chlamydia by NAA: NEGATIVE
Gonococcus by NAA: NEGATIVE
Trich vag by NAA: NEGATIVE

## 2018-05-24 LAB — CULTURE, GROUP A STREP: Strep A Culture: NEGATIVE

## 2018-05-25 ENCOUNTER — Ambulatory Visit (INDEPENDENT_AMBULATORY_CARE_PROVIDER_SITE_OTHER): Payer: BLUE CROSS/BLUE SHIELD | Admitting: Physician Assistant

## 2018-05-25 ENCOUNTER — Encounter: Payer: Self-pay | Admitting: Physician Assistant

## 2018-05-25 VITALS — BP 108/68 | HR 69 | Temp 98.5°F | Resp 16 | Ht 65.0 in | Wt 135.2 lb

## 2018-05-25 DIAGNOSIS — Z130 Encounter for screening for diseases of the blood and blood-forming organs and certain disorders involving the immune mechanism: Secondary | ICD-10-CM

## 2018-05-25 DIAGNOSIS — Z1329 Encounter for screening for other suspected endocrine disorder: Secondary | ICD-10-CM | POA: Diagnosis not present

## 2018-05-25 DIAGNOSIS — Z1321 Encounter for screening for nutritional disorder: Secondary | ICD-10-CM

## 2018-05-25 DIAGNOSIS — R5383 Other fatigue: Secondary | ICD-10-CM

## 2018-05-25 DIAGNOSIS — Z13228 Encounter for screening for other metabolic disorders: Secondary | ICD-10-CM | POA: Diagnosis not present

## 2018-05-25 DIAGNOSIS — Z0000 Encounter for general adult medical examination without abnormal findings: Secondary | ICD-10-CM

## 2018-05-25 LAB — POCT CBC
Granulocyte percent: 60.7 % (ref 37–80)
HCT, POC: 43 % — AB (ref 43.5–53.7)
Hemoglobin: 13.7 g/dL — AB (ref 14.1–18.1)
Lymph, poc: 2.2 (ref 0.6–3.4)
MCH, POC: 27.7 pg (ref 27–31.2)
MCHC: 32 g/dL (ref 31.8–35.4)
MCV: 86.5 fL (ref 80–97)
MID (cbc): 0.4 (ref 0–0.9)
MPV: 10 fL (ref 0–99.8)
POC Granulocyte: 3.9 (ref 2–6.9)
POC LYMPH PERCENT: 33.5 %L (ref 10–50)
POC MID %: 5.8 % (ref 0–12)
Platelet Count, POC: 164 10*3/uL (ref 142–424)
RBC: 4.97 M/uL (ref 4.69–6.13)
RDW, POC: 12.7 %
WBC: 6.5 10*3/uL (ref 4.6–10.2)

## 2018-05-25 LAB — POCT URINALYSIS DIP (MANUAL ENTRY)
Bilirubin, UA: NEGATIVE
Blood, UA: NEGATIVE
Glucose, UA: NEGATIVE mg/dL
Ketones, POC UA: NEGATIVE mg/dL
Leukocytes, UA: NEGATIVE
Nitrite, UA: NEGATIVE
Protein Ur, POC: NEGATIVE mg/dL
Spec Grav, UA: 1.025 (ref 1.010–1.025)
Urobilinogen, UA: 0.2 E.U./dL
pH, UA: 7 (ref 5.0–8.0)

## 2018-05-25 LAB — POCT GLYCOSYLATED HEMOGLOBIN (HGB A1C): Hemoglobin A1C: 5.4 % (ref 4.0–5.6)

## 2018-05-25 LAB — GC NAA, PHARYNGEAL: N GONORRHOEA RRNA NPH QL PCR: NEGATIVE

## 2018-05-25 IMAGING — DX DG CHEST 2V
2 series · 2 of 2 positions shown · non-contrast
Comparison: None.

CLINICAL DATA: Chest pain, SOB and overall weakness since this
morning - no known heart or lung issues, nonsmoker

EXAM:
CHEST  2 VIEW

[chest pa]
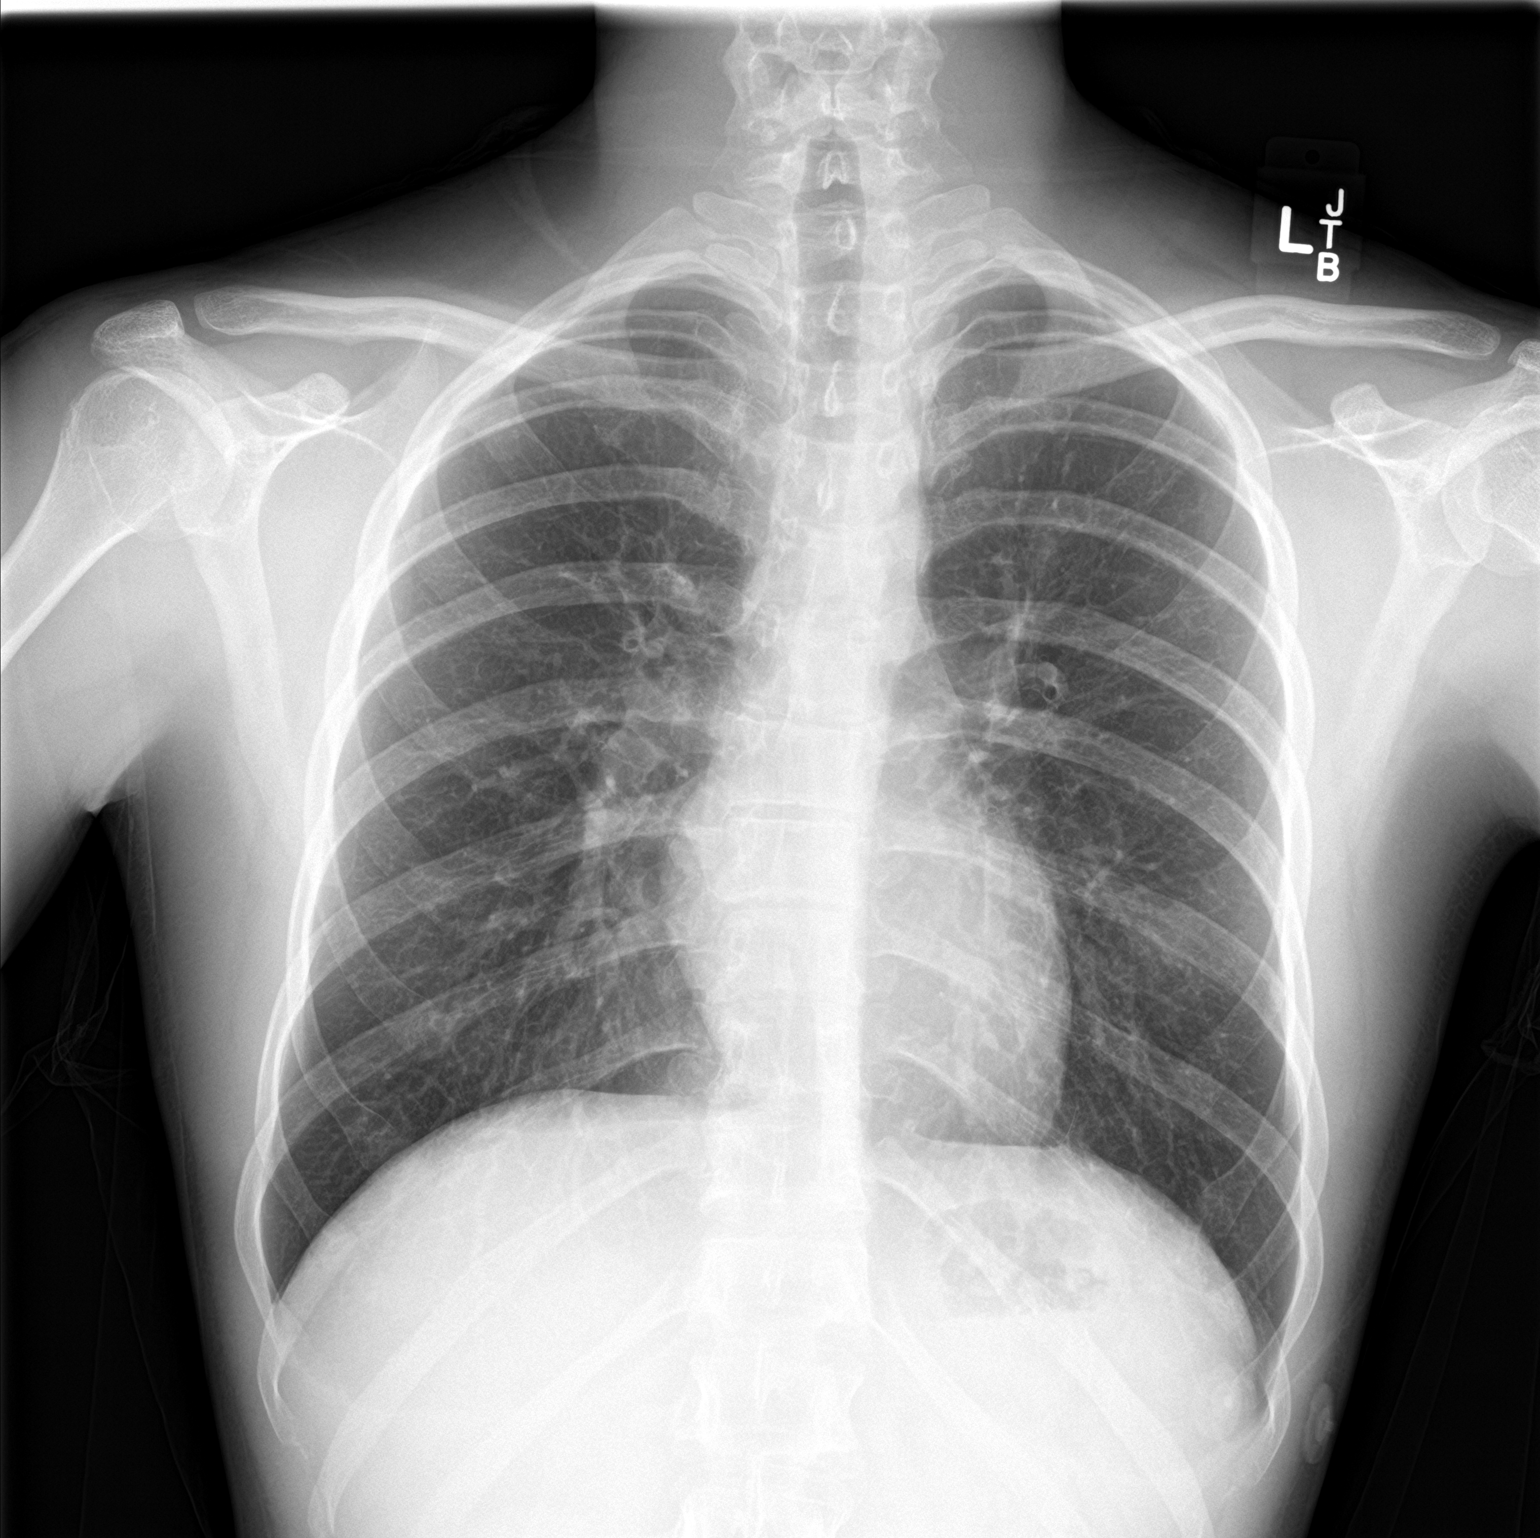

[chest lat]
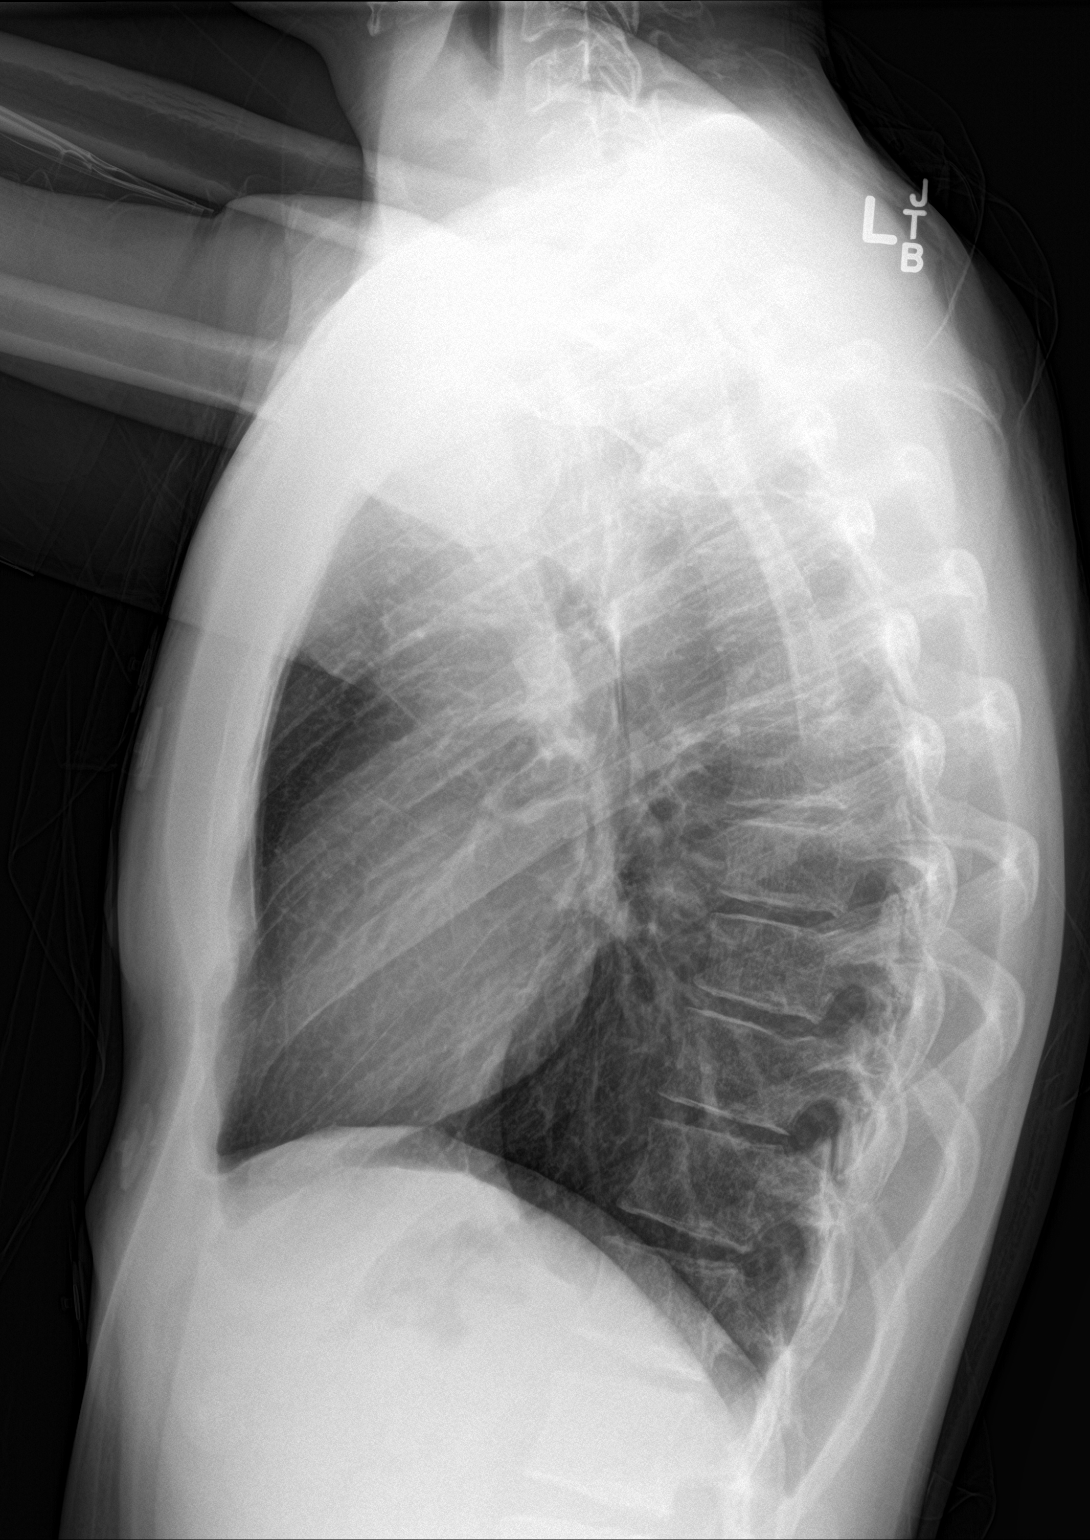

[2 of 2 positions shown; findings below may reference images not displayed]

FINDINGS: Normal mediastinum and cardiac silhouette. Normal pulmonary
vasculature. No evidence of effusion, infiltrate, or pneumothorax.
No acute bony abnormality.
IMPRESSION: Normal chest radiograph.

## 2018-05-25 NOTE — Progress Notes (Signed)
[facility], [place] [address] [phone] [phone]  Date:  05/25/2018   Name:  Jonathan Benson   DOB:  12-Sep-1996   MRN:  [id number]  PCP:  Patient, No Pcp Per    Chief Complaint: Annual Exam (overdue health maint discussed w/pt per pt "throat felt better after 24 hrs")   History of Present Illness:  This is a 22 y.o. male with PMH none who is presenting for CPE. Last annual exam.  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   Complaints:  Fatigue x 4 years. "I can sleep like 10 hours". Doesn't feel rested at all. Fatigue is rated 5/10. He can still function.  Denies snoring. Denies new medications, dizziness, lightheadedness, morning headaches, extreme daytime fatigue.   His mom has similar symptoms, he cannot recall what disease she has.    Itching x 1 year. Happens about 4x/day episodes last 15 min. He has been treated for anxiety in the past "but I didn't feel like the medications helped at all"  Immunizations: needs tdap Dentist: q 6 months Eye: q yearly. 20/15 b/l.  Diet: salad, soup. Cut back on fast foods. Drinks plenty of water.  Exercise: runs or walks daily.   Sexual hx: active with men only. One partner. Uses condoms with every encounter. Gets STD checks q yearly at school.   Tobacco/alcohol/substance use: never smoker    Review of Systems:  Review of Systems  Constitutional: Positive for fatigue. Negative for activity change and appetite change.  HENT: Negative for congestion, dental problem, sneezing and tinnitus.   Eyes: Negative for visual disturbance.  Respiratory: Negative for cough, chest tightness, shortness of breath and wheezing.   Cardiovascular: Negative for chest pain, palpitations and leg swelling.  Gastrointestinal: Negative for abdominal pain, blood in stool, constipation, diarrhea, nausea and vomiting.  Endocrine: Negative for polydipsia, polyphagia and polyuria.  Genitourinary: Negative for decreased urine volume, difficulty  urinating, discharge, hematuria, scrotal swelling and testicular pain.  Musculoskeletal: Negative for arthralgias, back pain and neck stiffness.  Allergic/Immunologic: Positive for environmental allergies. Negative for food allergies.  Neurological: Negative for dizziness, syncope, weakness, light-headedness and headaches.  Psychiatric/Behavioral: Negative for sleep disturbance. The patient is nervous/anxious.     There are no active problems to display for this patient.   Prior to Admission medications   Medication Sig Start Date End Date Taking? Authorizing Provider  penicillin v potassium (VEETID) 500 MG tablet Take 1 tablet (500 mg total) by mouth 2 (two) times daily. 05/22/18  Yes Perley Arthurs, Gelene Mink, PA-C  hydrOXYzine (ATARAX/VISTARIL) 25 MG tablet Take 1 tablet (25 mg total) by mouth every 8 (eight) hours as needed for itching. Patient not taking: Reported on 05/22/2018 05/17/18   Brason Berthelot, Gelene Mink, PA-C    No Known Allergies   Social History   Tobacco Use  . Smoking status: Never Smoker  . Smokeless tobacco: Never Used  Substance Use Topics  . Alcohol use: No  . Drug use: No    No family history on file.  Medication list has been reviewed and updated.  Physical Examination:  Physical Exam  Constitutional: He is oriented to person, place, and time. He appears well-developed and well-nourished.  Neck: Normal range of motion. No thyromegaly present.  Cardiovascular: Normal rate and regular rhythm.  Pulmonary/Chest: Effort normal. No respiratory distress.  Abdominal: Soft. There is no tenderness.  Neurological: He is alert and oriented to person, place, and time.  Skin: Skin is warm and dry.  Psychiatric: He has a normal mood and affect. His behavior is normal. Judgment and thought content normal.  Vitals reviewed.   BP 108/68 (BP Location: Right Arm, Patient Position: Sitting, Cuff Size: Normal)   Pulse 69   Temp 98.5 F (36.9 C) (Oral)   Resp 16    Ht 5' 5"  (1.651 m)   Wt 135 lb 3.2 oz (61.3 kg)   SpO2 100%   BMI 22.50 kg/m  Results for orders placed or performed in visit on 05/25/18  POCT urinalysis dipstick  Result Value Ref Range   Color, UA yellow yellow   Clarity, UA clear clear   Glucose, UA negative negative mg/dL   Bilirubin, UA negative negative   Ketones, POC UA negative negative mg/dL   Spec Grav, UA 1.025 1.010 - 1.025   Blood, UA negative negative   pH, UA 7.0 5.0 - 8.0   Protein Ur, POC negative negative mg/dL   Urobilinogen, UA 0.2 0.2 or 1.0 E.U./dL   Nitrite, UA Negative Negative   Leukocytes, UA Negative Negative  POCT CBC  Result Value Ref Range   WBC 6.5 4.6 - 10.2 K/uL   Lymph, poc 2.2 0.6 - 3.4   POC LYMPH PERCENT 33.5 10 - 50 %L   MID (cbc) 0.4 0 - 0.9   POC MID % 5.8 0 - 12 %M   POC Granulocyte 3.9 2 - 6.9   Granulocyte percent 60.7 37 - 80 %G   RBC 4.97 4.69 - 6.13 M/uL   Hemoglobin 13.7 (A) 14.1 - 18.1 g/dL   HCT, POC 43.0 (A) 43.5 - 53.7 %   MCV 86.5 80 - 97 fL   MCH, POC 27.7 27 - 31.2 pg   MCHC 32.0 31.8 - 35.4 g/dL   RDW, POC 12.7 %   Platelet Count, POC 164 142 - 424 K/uL   MPV 10.0 0 - 99.8 fL  POCT glycosylated hemoglobin (Hb A1C)  Result Value Ref Range   Hemoglobin A1C 5.4 4.0 - 5.6 %   HbA1c POC (<> result, manual entry)     HbA1c, POC (prediabetic range)     HbA1c, POC (controlled diabetic range)      Assessment and Plan: 1. Annual physical exam _ pt presents for annual exam. Routine labs are pending. Will contact with results. Anticipatory guidance discussed.   2. Fatigue, unspecified type - TSH  3. Screening for endocrine, nutritional, metabolic and immunity disorder - CBC with Differential/Platelet - TSH - CMP14+EGFR - POCT urinalysis dipstick - Hemoglobin A1c - Lipid panel - Vitamin B12   Mercer Pod, PA-C  [facility] 05/25/2018 8:16 AM

## 2018-05-25 NOTE — Patient Instructions (Addendum)
Thank you for allowing me to take part in caring for your health.  These are some of my general health and wellness recommendations. Some of them may apply to you better than others. Please use common sense as you try these suggestions and feel free to ask me any questions.   RELAXATION Consider practicing mindfulness meditation or other relaxation techniques such as deep breathing, prayer, yoga, tai chi, massage. See website mindful.org or the apps Headspace or Calm to help get started.  ACTIVITY/FITNESS Mental, social, emotional and physical stimulation are very important for brain and body health. Try learning a new activity (arts, music, language, sports, games).  Keep moving your body to the best of your abilities. You can do this at home, inside or outside, the park, community center, gym or anywhere you like. Consider a physical therapist or personal trainer to get started. Consider the app Sworkit. Fitness trackers such as smart-watches, smart-phones or Fitbits can help as well.   NUTRITION Eat more plants: colorful vegetables, nuts, seeds and berries. Eat less sugar, salt, preservatives and processed foods. Avoid toxins such as cigarettes and alcohol. Drink water when you are thirsty. Warm water with a slice of lemon is an excellent morning drink to start the day.  Consider these websites for more information The Nutrition Source (https://www.henry-hernandez.biz/) Precision Nutrition (WindowBlog.ch)   SLEEP Try to get at least 7-8+ hours sleep per day. Regular exercise and reduced caffeine will help you sleep better. Practice good sleep hygeine techniques. See website sleep.org for more information.   If you have lab work done today you will be contacted with your lab results within the next 2 weeks.  If you have not heard from [place] then please contact us. The fastest way to get your results is to register for My Chart.  Thank  you for coming in today. I hope you feel we met your needs.  Feel free to call PCP if you have any questions or further requests.  Please consider signing up for MyChart if you do not already have it, as this is a great way to communicate with me.  Best,  Jonathan McVey, PA-C   IF you received an x-ray today, you will receive an invoice from [facility]. Please contact [facility] at [phone] with questions or concerns regarding your invoice.   IF you received labwork today, you will receive an invoice from [facility]. Please contact [facility] at [phone] with questions or concerns regarding your invoice.   Our billing staff will not be able to assist you with questions regarding bills from these companies.  You will be contacted with the lab results as soon as they are available. The fastest way to get your results is to activate your My Chart account. Instructions are located on the last page of this paperwork. If you have not heard from [place] regarding the results in 2 weeks, please contact this office.

## 2018-05-26 LAB — LIPID PANEL
Chol/HDL Ratio: 2.8 ratio (ref 0.0–5.0)
Cholesterol, Total: 111 mg/dL (ref 100–199)
HDL: 40 mg/dL (ref >39)
LDL Calculated: 61 mg/dL (ref 0–99)
Triglycerides: 50 mg/dL (ref 0–149)
VLDL Cholesterol Cal: 10 mg/dL (ref 5–40)

## 2018-05-26 LAB — CMP14+EGFR
ALT: 13 IU/L (ref 0–44)
AST: 18 IU/L (ref 0–40)
Albumin/Globulin Ratio: 1.6 (ref 1.2–2.2)
Albumin: 4.6 g/dL (ref 3.5–5.5)
Alkaline Phosphatase: 64 IU/L (ref 39–117)
BUN/Creatinine Ratio: 12 (ref 9–20)
BUN: 11 mg/dL (ref 6–20)
Bilirubin Total: 0.2 mg/dL (ref 0.0–1.2)
CO2: 21 mmol/L (ref 20–29)
Calcium: 9.1 mg/dL (ref 8.7–10.2)
Chloride: 102 mmol/L (ref 96–106)
Creatinine, Ser: 0.92 mg/dL (ref 0.76–1.27)
GFR calc Af Amer: 138 mL/min/{1.73_m2} (ref >59)
GFR calc non Af Amer: 119 mL/min/{1.73_m2} (ref >59)
Globulin, Total: 2.8 g/dL (ref 1.5–4.5)
Glucose: 87 mg/dL (ref 65–99)
Potassium: 3.9 mmol/L (ref 3.5–5.2)
Sodium: 143 mmol/L (ref 134–144)
Total Protein: 7.4 g/dL (ref 6.0–8.5)

## 2018-05-26 LAB — VITAMIN B12: Vitamin B-12: 631 pg/mL (ref 232–1245)

## 2018-05-26 LAB — TSH: TSH: 1.77 u[IU]/mL (ref 0.450–4.500)

## 2019-04-10 DIAGNOSIS — H52203 Unspecified astigmatism, bilateral: Secondary | ICD-10-CM | POA: Diagnosis not present

## 2019-04-10 DIAGNOSIS — H5213 Myopia, bilateral: Secondary | ICD-10-CM | POA: Diagnosis not present

## 2019-11-02 ENCOUNTER — Ambulatory Visit: Payer: BC Managed Care – PPO | Attending: Internal Medicine

## 2019-11-02 DIAGNOSIS — Z20822 Contact with and (suspected) exposure to covid-19: Secondary | ICD-10-CM

## 2019-11-03 LAB — NOVEL CORONAVIRUS, NAA: SARS-CoV-2, NAA: NOT DETECTED

## 2019-11-09 DIAGNOSIS — R6883 Chills (without fever): Secondary | ICD-10-CM | POA: Diagnosis not present

## 2019-11-09 DIAGNOSIS — R5383 Other fatigue: Secondary | ICD-10-CM | POA: Diagnosis not present

## 2019-11-09 DIAGNOSIS — R52 Pain, unspecified: Secondary | ICD-10-CM | POA: Diagnosis not present

## 2019-11-09 DIAGNOSIS — R509 Fever, unspecified: Secondary | ICD-10-CM | POA: Diagnosis not present

## 2019-11-09 DIAGNOSIS — Z20822 Contact with and (suspected) exposure to covid-19: Secondary | ICD-10-CM | POA: Diagnosis not present

## 2020-04-09 DIAGNOSIS — H5213 Myopia, bilateral: Secondary | ICD-10-CM | POA: Diagnosis not present

## 2020-04-09 DIAGNOSIS — H52203 Unspecified astigmatism, bilateral: Secondary | ICD-10-CM | POA: Diagnosis not present

## 2020-04-10 DIAGNOSIS — J029 Acute pharyngitis, unspecified: Secondary | ICD-10-CM | POA: Diagnosis not present

## 2020-04-10 DIAGNOSIS — Z20822 Contact with and (suspected) exposure to covid-19: Secondary | ICD-10-CM | POA: Diagnosis not present

## 2020-04-10 DIAGNOSIS — R509 Fever, unspecified: Secondary | ICD-10-CM | POA: Diagnosis not present

## 2020-08-22 DIAGNOSIS — J029 Acute pharyngitis, unspecified: Secondary | ICD-10-CM | POA: Diagnosis not present

## 2020-08-22 DIAGNOSIS — J020 Streptococcal pharyngitis: Secondary | ICD-10-CM | POA: Diagnosis not present

## 2020-08-22 DIAGNOSIS — R52 Pain, unspecified: Secondary | ICD-10-CM | POA: Diagnosis not present

## 2020-08-22 DIAGNOSIS — R5081 Fever presenting with conditions classified elsewhere: Secondary | ICD-10-CM | POA: Diagnosis not present

## 2020-08-22 DIAGNOSIS — R509 Fever, unspecified: Secondary | ICD-10-CM | POA: Diagnosis not present

## 2021-04-09 DIAGNOSIS — H5213 Myopia, bilateral: Secondary | ICD-10-CM | POA: Diagnosis not present

## 2021-04-09 DIAGNOSIS — H52203 Unspecified astigmatism, bilateral: Secondary | ICD-10-CM | POA: Diagnosis not present

## 2022-04-19 DIAGNOSIS — H5213 Myopia, bilateral: Secondary | ICD-10-CM | POA: Diagnosis not present

## 2022-04-19 DIAGNOSIS — H52203 Unspecified astigmatism, bilateral: Secondary | ICD-10-CM | POA: Diagnosis not present

## 2022-11-06 DIAGNOSIS — Z20822 Contact with and (suspected) exposure to covid-19: Secondary | ICD-10-CM | POA: Diagnosis not present

## 2022-11-06 DIAGNOSIS — R519 Headache, unspecified: Secondary | ICD-10-CM | POA: Diagnosis not present

## 2022-11-06 DIAGNOSIS — J029 Acute pharyngitis, unspecified: Secondary | ICD-10-CM | POA: Diagnosis not present

## 2022-12-04 DIAGNOSIS — J069 Acute upper respiratory infection, unspecified: Secondary | ICD-10-CM | POA: Diagnosis not present

## 2022-12-04 DIAGNOSIS — Z20822 Contact with and (suspected) exposure to covid-19: Secondary | ICD-10-CM | POA: Diagnosis not present

## 2022-12-13 DIAGNOSIS — R059 Cough, unspecified: Secondary | ICD-10-CM | POA: Diagnosis not present

## 2022-12-13 DIAGNOSIS — Z113 Encounter for screening for infections with a predominantly sexual mode of transmission: Secondary | ICD-10-CM | POA: Diagnosis not present

## 2022-12-13 DIAGNOSIS — R5383 Other fatigue: Secondary | ICD-10-CM | POA: Diagnosis not present

## 2022-12-14 DIAGNOSIS — R5383 Other fatigue: Secondary | ICD-10-CM | POA: Diagnosis not present

## 2022-12-14 DIAGNOSIS — Z113 Encounter for screening for infections with a predominantly sexual mode of transmission: Secondary | ICD-10-CM | POA: Diagnosis not present

## 2023-01-16 DIAGNOSIS — M25462 Effusion, left knee: Secondary | ICD-10-CM | POA: Diagnosis not present

## 2023-01-16 DIAGNOSIS — J069 Acute upper respiratory infection, unspecified: Secondary | ICD-10-CM | POA: Diagnosis not present

## 2023-01-16 DIAGNOSIS — R509 Fever, unspecified: Secondary | ICD-10-CM | POA: Diagnosis not present

## 2023-01-16 DIAGNOSIS — H6503 Acute serous otitis media, bilateral: Secondary | ICD-10-CM | POA: Diagnosis not present

## 2023-01-18 ENCOUNTER — Telehealth: Payer: Self-pay | Admitting: Physician Assistant

## 2023-01-18 NOTE — Telephone Encounter (Signed)
Sure, I will accept him as a new patient as I care for a family member.  Okay to schedule new patient appointment.  Thanks

## 2023-01-18 NOTE — Telephone Encounter (Signed)
Pt would like to know if Jonathan Benson will take him on as a pt. Carly Applegate is his mother. As well as other family members.

## 2023-01-18 NOTE — Telephone Encounter (Signed)
Will you accept this patient?

## 2023-01-19 NOTE — Telephone Encounter (Signed)
Called to schedule, no answer, LM to call back to schedule new patient appt

## 2023-01-19 NOTE — Telephone Encounter (Signed)
Patient new patient appt is scheduled.

## 2023-01-27 DIAGNOSIS — M25562 Pain in left knee: Secondary | ICD-10-CM | POA: Diagnosis not present

## 2023-01-27 DIAGNOSIS — M25462 Effusion, left knee: Secondary | ICD-10-CM | POA: Diagnosis not present

## 2023-02-02 ENCOUNTER — Encounter: Payer: Self-pay | Admitting: Family Medicine

## 2023-02-02 ENCOUNTER — Ambulatory Visit (INDEPENDENT_AMBULATORY_CARE_PROVIDER_SITE_OTHER): Payer: BC Managed Care – PPO | Admitting: Family Medicine

## 2023-02-02 VITALS — BP 110/56 | HR 77 | Temp 98.7°F | Ht 65.0 in | Wt 141.2 lb

## 2023-02-02 DIAGNOSIS — D649 Anemia, unspecified: Secondary | ICD-10-CM | POA: Diagnosis not present

## 2023-02-02 DIAGNOSIS — M1310 Monoarthritis, not elsewhere classified, unspecified site: Secondary | ICD-10-CM

## 2023-02-02 DIAGNOSIS — M25462 Effusion, left knee: Secondary | ICD-10-CM

## 2023-02-02 DIAGNOSIS — R509 Fever, unspecified: Secondary | ICD-10-CM | POA: Diagnosis not present

## 2023-02-02 DIAGNOSIS — R7982 Elevated C-reactive protein (CRP): Secondary | ICD-10-CM | POA: Diagnosis not present

## 2023-02-02 NOTE — Patient Instructions (Signed)
I will check some labs today, and can discuss those results as well as next steps for your knee pain and febrile symptoms at follow-up visit in 2 weeks.  If any worsening symptoms in the meantime, let me know.  Depending on results we can discuss a rheumatology referral or other specialist evaluation but I would like to see some repeat tests first.  Keep follow-up with orthopedics as planned as they may want to do other imaging.  Let them know I did repeat some lab tests here today.  Take care.
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
## 2023-02-03 LAB — ANTISTREPTOLYSIN O TITER: ASO: 413 IU/mL — ABNORMAL HIGH (ref 0.0–200.0)

## 2023-02-03 LAB — CBC
Hematocrit: 44.9 % (ref 37.5–51.0)
Hemoglobin: 15 g/dL (ref 13.0–17.7)
MCH: 30 pg (ref 26.6–33.0)
MCHC: 33.4 g/dL (ref 31.5–35.7)
MCV: 90 fL (ref 79–97)
Platelets: 188 10*3/uL (ref 150–450)
RBC: 5 x10E6/uL (ref 4.14–5.80)
RDW: 12.3 % (ref 11.6–15.4)
WBC: 5.4 10*3/uL (ref 3.4–10.8)

## 2023-02-03 LAB — URIC ACID: Uric Acid: 5.3 mg/dL (ref 3.8–8.4)

## 2023-02-03 LAB — RHEUMATOID FACTOR: Rheumatoid fact SerPl-aCnc: <10 IU/mL (ref <14.0)

## 2023-02-03 LAB — C-REACTIVE PROTEIN: CRP: <1 mg/L (ref 0–10)

## 2023-02-03 LAB — ANA: Anti Nuclear Antibody (ANA): NEGATIVE

## 2023-02-09 DIAGNOSIS — M25562 Pain in left knee: Secondary | ICD-10-CM | POA: Diagnosis not present

## 2023-02-16 ENCOUNTER — Ambulatory Visit: Payer: Self-pay | Admitting: Family Medicine

## 2023-04-01 ENCOUNTER — Telehealth: Payer: Self-pay

## 2023-04-01 NOTE — Telephone Encounter (Signed)
Attempted to call pt with lab results . No answer no Vm set up sent results Via my chart

## 2023-04-05 ENCOUNTER — Telehealth: Payer: Self-pay

## 2023-04-05 NOTE — Telephone Encounter (Signed)
Mailed patient a letter after multiple failed attempts to reach him by phone

## 2023-04-06 NOTE — Telephone Encounter (Signed)
Attempted to reach pt again VM is still full no answer and unable to leave VM

## 2024-11-01 ENCOUNTER — Ambulatory Visit: Admitting: Family Medicine

## 2024-11-01 VITALS — BP 92/58 | HR 105 | Temp 102.4°F | Resp 18 | Ht 65.0 in | Wt 134.4 lb

## 2024-11-01 DIAGNOSIS — R5383 Other fatigue: Secondary | ICD-10-CM

## 2024-11-01 DIAGNOSIS — R509 Fever, unspecified: Secondary | ICD-10-CM

## 2024-11-01 DIAGNOSIS — R051 Acute cough: Secondary | ICD-10-CM

## 2024-11-01 DIAGNOSIS — J101 Influenza due to other identified influenza virus with other respiratory manifestations: Secondary | ICD-10-CM

## 2024-11-01 DIAGNOSIS — J029 Acute pharyngitis, unspecified: Secondary | ICD-10-CM

## 2024-11-01 LAB — POCT RAPID STREP A (OFFICE): Rapid Strep A Screen: NEGATIVE

## 2024-11-01 LAB — POCT INFLUENZA A/B
Influenza A, POC: NEGATIVE
Influenza B, POC: POSITIVE — AB

## 2024-11-01 LAB — POC COVID19 BINAXNOW: SARS Coronavirus 2 Ag: NEGATIVE

## 2024-11-01 MED ORDER — GUAIFENESIN-CODEINE 100-10 MG/5ML PO SOLN: 5.0000 mL | Freq: Every evening | ORAL | 0 refills | Status: AC | PRN

## 2024-11-01 NOTE — Patient Instructions (Addendum)
 Unfortunately you do have influenza A, influenza B was noted on your testing today.  That would explain your symptoms.  As we discussed that is a viral illness, so I do not see a role for antibiotics at this time.  Tamiflu is an option within the first 48 hours, but given your side effects in the past and now that we are beyond the 48 hours of symptoms I think it would be reasonable to hold on that medication for now.  Continue to drink plenty of fluids.  Tylenol or Advil can be used for fever, and bodyaches.  Mucinex  can be used for cough.  If cough is keeping you up at night, can switch to the codeine  cough syrup that I prescribed at bedtime.  If you have any increasing lightheadedness or dizziness, or worsening shortness of breath in the next 24 hours, be seen through the emergency room.   As long as you are improving, okay to continue treatment at home with fluids, rest and other information below.  You are considered to be less contagious once you are fever free for 24 hours off of fever reducing medicine like Advil or Aleve and other symptoms are improving.  I will schedule follow-up in the next few weeks for discussion of the fatigue over the past year.  I did check some labs today including your blood counts electrolytes and thyroid  test.  Last blood count in 2024 did not indicate anemia.  The Flu (Influenza) in Adults: What to Know Influenza is also called the flu. It's an infection that affects your respiratory tract. This includes your nose, throat, windpipe, and lungs. The flu is contagious. This means it spreads easily from person to person. It causes symptoms that are like a cold. It can also cause a high fever and body aches. What are the causes? The flu is caused by the influenza virus. You can get it by: Breathing in droplets that are in the air after an infected person coughs or sneezes. Touching something that has the virus on it and then touching your mouth, nose, or eyes. What  increases the risk? You may be more likely to get the flu if: You don't wash your hands often. You're near a lot of people during cold and flu season. You touch your mouth, eyes, or nose without washing your hands first. You don't get a flu shot each year. You may also be more at risk for the flu and serious problems, such as a lung infection called pneumonia, if: You're older than 65. You're pregnant. Your immune system is weak. Your immune system is your body's defense system. You have a long-term, or chronic, condition, such as: Heart, kidney, or lung disease. Diabetes. A liver disorder. Asthma. You're very overweight. You have anemia. This is when you don't have enough red blood cells in your body. What are the signs or symptoms? Flu symptoms often start all of a sudden. They may last 4-14 days and include: Fever and chills. Headaches, body aches, or muscle aches. Sore throat. Cough. Runny or stuffy nose. Discomfort in your chest. Not wanting to eat as much as normal. Feeling weak or tired. Feeling dizzy. Nausea or vomiting. How is this diagnosed? The flu may be diagnosed based on your symptoms and medical history. You may also have a physical exam. A swab may be taken from your nose or throat and tested for the virus. How is this treated? If the flu is found early, you can be treated with  antiviral medicine. This may be given to you by mouth or through an IV. It can help you feel less sick and get better faster. Taking care of yourself at home can also help your symptoms get better. Your health care provider may tell you to: Take over-the-counter medicines. Drink lots of fluids. The flu often goes away on its own. If you have very bad symptoms or problems caused by the flu, you may need to be treated in a hospital. Follow these instructions at home: Activity Rest as needed. Get lots of sleep. Stay home from work or school as told by your provider. Leave home only to go  see your provider. Do not leave home for other reasons until you don't have a fever for 24 hours without taking medicine. Eating and drinking Take an oral rehydration solution (ORS). This is a drink that is sold at pharmacies and stores. Drink enough fluid to keep your pee pale yellow. Try to drink small amounts of clear fluids. These include water, ice chips, fruit juice mixed with water, and low-calorie sports drinks. Try to eat bland foods that are easy to digest. These include bananas, applesauce, rice, lean meats, toast, and crackers. Avoid drinks that have a lot of sugar or caffeine in them. These include energy drinks, regular sports drinks, and soda. Do not drink alcohol. Do not eat spicy or fatty foods. General instructions     Take your medicines only as told by your provider. Use a cool mist humidifier to add moisture to the air in your home. This can make it easier for you to breathe. You should also clean the humidifier every day. To do so: Empty the water. Pour clean water in. Cover your mouth and nose when you cough or sneeze. Wash your hands with soap and water often and for at least 20 seconds. It's extra important to do so after you cough or sneeze. If you can't use soap and water, use hand sanitizer. How is this prevented?  Get a flu shot every year. Ask your provider when you should get your flu shot. Stay away from people who are sick during fall and winter. Fall and winter are cold and flu season. Contact a health care provider if: You get new symptoms. You have chest pain. You have watery poop, also called diarrhea. You have a fever. Your cough gets worse. You start to have more mucus. You feel like you may vomit, or you vomit. Get help right away if: You become short of breath or have trouble breathing. Your skin or nails turn blue. You have very bad pain or stiffness in your neck. You get a sudden headache or pain in your face or ear. You vomit each time  you eat or drink. These symptoms may be an emergency. Call 911 right away. Do not wait to see if the symptoms will go away. Do not drive yourself to the hospital. This information is not intended to replace advice given to you by your health care provider. Make sure you discuss any questions you have with your health care provider. Document Revised: 07/24/2024 Document Reviewed: 10/21/2022 Elsevier Patient Education  2025 [employer].

## 2024-11-01 NOTE — Progress Notes (Unsigned)
 "  Subjective:  Patient ID: Jonathan Benson, male    DOB: 07-Feb-1996  Age: 29 y.o. MRN: [id number]  CC:  Chief Complaint  Patient presents with   Acute Visit    Fever, chills, runny nose and body aches. Sx started 2 days ago. Getting worse     HPI Waller G Bastidas presents for acute visit for above.  Fever chills runny nose and body aches Initial symptoms started 2 days ago in the morning. low grade fever initially, then above symptoms.  Started feeling worse that day and into yesterday.  Cough, intermittent shortness of breath with cough.  He has been able to drink fluids, normal urine output x 3 today, light yellow.  Few episodes of diarrhea per day. Initial treatment of NyQuil, DayQuil, able to sleep 2 nights ago, difficult to sleep last night due to cough.  Last meds taken were earlier this morning, no recent antipyretics.  He does notice some fatigue over the past year.  Hemoglobin was borderline low in the past but normal hemoglobin of 15.0 in May 2024.  He did not tolerate Tamiflu in the past, gastrointestinal side effects.  Declines repeat trial for current illness.  BP Readings from Last 3 Encounters:  11/01/24 (!) 92/58  02/02/23 (!) 110/56  05/25/18 108/68    There is no immunization history on file for this patient. Did not have flu vaccine this year.  History There are no active problems to display for this patient.  Past Medical History:  Diagnosis Date   Depression    No past surgical history on file. Allergies[1] Prior to Admission medications  Medication Sig Start Date End Date Taking? Authorizing Provider  hydrOXYzine  (ATARAX /VISTARIL ) 25 MG tablet Take 1 tablet (25 mg total) by mouth every 8 (eight) hours as needed for itching. Patient not taking: Reported on 05/22/2018 05/17/18   McVey, Almarie Folks, PA-C  meloxicam (MOBIC) 15 MG tablet Take 15 mg by mouth daily. Patient not taking: Reported on 11/01/2024    [provider]  penicillin  v  potassium (VEETID) 500 MG tablet Take 1 tablet (500 mg total) by mouth 2 (two) times daily. 05/22/18   McVey, Almarie Folks, PA-C   Social History   Socioeconomic History   Marital status: Single    Spouse name: Not on file   Number of children: Not on file   Years of education: Not on file   Highest education level: Bachelor's degree (e.g., BA, AB, BS)  Occupational History   Not on file  Tobacco Use   Smoking status: Never   Smokeless tobacco: Never  Substance and Sexual Activity   Alcohol use: No   Drug use: No   Sexual activity: Not on file  Other Topics Concern   Not on file  Social History Narrative   Not on file   Social Drivers of Health   Tobacco Use: Low Risk (11/01/2024)   Patient History    Smoking Tobacco Use: Never    Smokeless Tobacco Use: Never    Passive Exposure: Not on file  Financial Resource Strain: Low Risk (11/01/2024)   Overall Financial Resource Strain (CARDIA)    Difficulty of Paying Living Expenses: Not hard at all  Food Insecurity: No Food Insecurity (11/01/2024)   Epic    Worried About Radiation Protection Practitioner of Food in the Last Year: Never true    Ran Out of Food in the Last Year: Never true  Transportation Needs: No Transportation Needs (11/01/2024)   Epic  Lack of Transportation (Medical): No    Lack of Transportation (Non-Medical): No  Physical Activity: Sufficiently Active (11/01/2024)   Exercise Vital Sign    Days of Exercise per Week: 7 days    Minutes of Exercise per Session: 60 min  Stress: No Stress Concern Present (11/01/2024)   [employer] of Occupational Health - Occupational Stress Questionnaire    Feeling of Stress: Not at all  Social Connections: Moderately Integrated (11/01/2024)   Social Connection and Isolation Panel    Frequency of Communication with Friends and Family: More than three times a week    Frequency of Social Gatherings with Friends and Family: Twice a week    Attends Religious Services: Never    Doctor, General Practice or Organizations: Yes    Attends Engineer, Structural: More than 4 times per year    Marital Status: Living with partner  Intimate Partner Violence: Not At Risk (02/02/2023)   Humiliation, Afraid, Rape, and Kick questionnaire    Fear of Current or Ex-Partner: No    Emotionally Abused: No    Physically Abused: No    Sexually Abused: No  Depression (PHQ2-9): Low Risk (02/02/2023)   Depression (PHQ2-9)    PHQ-2 Score: 0  Alcohol Screen: Low Risk (11/01/2024)   Alcohol Screen    Last Alcohol Screening Score (AUDIT): 2  Housing: Low Risk (11/01/2024)   Epic    Unable to Pay for Housing in the Last Year: No    Number of Times Moved in the Last Year: 1    Homeless in the Last Year: No  Utilities: Not At Risk (02/02/2023)   [facility] Utilities    Threatened with loss of utilities: No  Health Literacy: Not on file    Review of Systems Per HPI.   Objective:   Vitals:   11/01/24 1412  BP: (!) 92/58  Pulse: (!) 105  Resp: 18  Temp: (!) 102.4 F (39.1 C)  TempSrc: Temporal  SpO2: 94%  Weight: 134 lb 6.4 oz (61 kg)  Height: 5' 5 (1.651 m)    Physical Exam   Results for orders placed or performed in visit on 11/01/24  POC COVID-19 BinaxNow   Collection Time: 11/01/24  2:32 PM  Result Value Ref Range   SARS Coronavirus 2 Ag Negative Negative  POCT rapid strep A   Collection Time: 11/01/24  2:33 PM  Result Value Ref Range   Rapid Strep A Screen Negative Negative  POCT Influenza A/B   Collection Time: 11/01/24  2:33 PM  Result Value Ref Range   Influenza A, POC Negative Negative   Influenza B, POC Positive (A) Negative     Assessment & Plan:  Jonathan Benson is a 29 y.o. male . Fever, unspecified fever cause - Plan: POCT Influenza A/B, POC COVID-19 BinaxNow  Sore throat - Plan: POCT rapid strep A   No orders of the defined types were placed in this encounter.  There are no Patient Instructions on file for this visit.    Signed,   Reyes Pines,  MD [facility], [facility] 11/01/24 2:35 PM       [1]  Allergies Allergen Reactions   Amoxicillin Nausea Only   "

## 2024-11-02 LAB — CBC
HCT: 44.7 % (ref 39.0–52.0)
Hemoglobin: 15.1 g/dL (ref 13.0–17.0)
MCHC: 33.8 g/dL (ref 30.0–36.0)
MCV: 87.9 fl (ref 78.0–100.0)
Platelets: 105 10*3/uL — ABNORMAL LOW (ref 150.0–400.0)
RBC: 5.09 Mil/uL (ref 4.22–5.81)
RDW: 12.7 % (ref 11.5–15.5)
WBC: 3.9 10*3/uL — ABNORMAL LOW (ref 4.0–10.5)

## 2024-11-02 LAB — BASIC METABOLIC PANEL WITH GFR
BUN: 7 mg/dL (ref 6–23)
CO2: 29 meq/L (ref 19–32)
Calcium: 9 mg/dL (ref 8.4–10.5)
Chloride: 99 meq/L (ref 96–112)
Creatinine, Ser: 0.98 mg/dL (ref 0.40–1.50)
GFR: 105.73 mL/min
Glucose, Bld: 95 mg/dL (ref 70–99)
Potassium: 3.7 meq/L (ref 3.5–5.1)
Sodium: 139 meq/L (ref 135–145)

## 2024-11-02 LAB — TSH: TSH: 0.57 u[IU]/mL (ref 0.35–5.50)

## 2024-11-16 ENCOUNTER — Ambulatory Visit: Admitting: Family Medicine
